# Patient Record
Sex: Female | Born: 2005 | Race: White | Hispanic: No | Marital: Single | Smoking: Former smoker
Health system: Southern US, Community
[De-identification: ages and names within clinical notes are randomized; demographics above are authoritative.]

## PROBLEM LIST (undated history)

## (undated) DIAGNOSIS — G90A Postural orthostatic tachycardia syndrome (POTS): Secondary | ICD-10-CM

## (undated) DIAGNOSIS — G901 Familial dysautonomia [Riley-Day]: Secondary | ICD-10-CM

## (undated) DIAGNOSIS — F419 Anxiety disorder, unspecified: Secondary | ICD-10-CM

## (undated) DIAGNOSIS — F32A Depression, unspecified: Secondary | ICD-10-CM

## (undated) DIAGNOSIS — T753XXA Motion sickness, initial encounter: Secondary | ICD-10-CM

## (undated) HISTORY — DX: Anxiety disorder, unspecified: F41.9

## (undated) HISTORY — DX: Depression, unspecified: F32.A

## (undated) HISTORY — PX: NO PAST SURGERIES: SHX2092

## (undated) HISTORY — DX: Postural orthostatic tachycardia syndrome (POTS): G90.A

---
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
## 2024-03-02 ENCOUNTER — Ambulatory Visit: Payer: Self-pay | Admitting: Internal Medicine

## 2024-03-05 ENCOUNTER — Ambulatory Visit (INDEPENDENT_AMBULATORY_CARE_PROVIDER_SITE_OTHER): Admitting: Internal Medicine

## 2024-03-05 ENCOUNTER — Encounter: Payer: Self-pay | Admitting: Internal Medicine

## 2024-03-05 VITALS — BP 108/70 | Ht 66.0 in | Wt 160.8 lb

## 2024-03-05 DIAGNOSIS — Z0001 Encounter for general adult medical examination with abnormal findings: Secondary | ICD-10-CM | POA: Diagnosis not present

## 2024-03-05 DIAGNOSIS — Z6825 Body mass index (BMI) 25.0-25.9, adult: Secondary | ICD-10-CM

## 2024-03-05 DIAGNOSIS — Z114 Encounter for screening for human immunodeficiency virus [HIV]: Secondary | ICD-10-CM

## 2024-03-05 DIAGNOSIS — Z1159 Encounter for screening for other viral diseases: Secondary | ICD-10-CM

## 2024-03-05 DIAGNOSIS — Z136 Encounter for screening for cardiovascular disorders: Secondary | ICD-10-CM

## 2024-03-05 DIAGNOSIS — R739 Hyperglycemia, unspecified: Secondary | ICD-10-CM

## 2024-03-05 DIAGNOSIS — E663 Overweight: Secondary | ICD-10-CM | POA: Diagnosis not present

## 2024-03-05 MED ORDER — ESCITALOPRAM OXALATE 10 MG PO TABS: 15.0000 mg | ORAL_TABLET | Freq: Every day | ORAL | 1 refills | Status: DC

## 2024-03-05 NOTE — Progress Notes (Signed)
 Subjective:    Patient ID: Samantha Mccormick Cancer, female    DOB: 03-Apr-2006, 18 y.o.   MRN: [id number]  HPI  Patient presents to clinic today for her annual exam.  Flu: never Tetanus: 03/2016 COVID: never Dentist: biannually  Diet: She does eat meat. She eats more fruits than veggies. She does eat some fried foods. She drinks mostly water. Exercise: None  Review of Systems   Past Medical History:  Diagnosis Date   Anxiety    Phreesia 11/08/2020   Depression    Dysautonomia (HCC)    POTS   Motion sickness    seasick   POTS (postural orthostatic tachycardia syndrome)     Current Outpatient Medications  Medication Sig Dispense Refill   escitalopram  (LEXAPRO ) 10 MG tablet Take 1 tablet (10 mg total) by mouth daily. 90 tablet 1   No current facility-administered medications for this visit.    No Active Allergies   Family History  Problem Relation Age of Onset   Migraines Mother    Anxiety disorder Mother    Depression Mother    Depression Father    Anxiety disorder Father    Bipolar disorder Father    ADD / ADHD Sister    ADD / ADHD Brother    Epilepsy Paternal Grandmother    Depression Paternal Grandmother    Anxiety disorder Paternal Grandmother    Bipolar disorder Paternal Grandmother    Schizophrenia Neg Hx    Autism Neg Hx     Social History   Socioeconomic History   Marital status: Single    Spouse name: Not on file   Number of children: Not on file   Years of education: Not on file   Highest education level: High school graduate  Occupational History   Not on file  Tobacco Use   Smoking status: Former    Types: E-cigarettes    Passive exposure: Past   Smokeless tobacco: Never  Vaping Use   Vaping status: Every Day   Substances: Nicotine, Flavoring   Devices: Lost Mary  Substance and Sexual Activity   Alcohol use: Never   Drug use: Never   Sexual activity: Yes    Birth control/protection: None  Other Topics Concern   Not on file  Social  History Narrative   Samantha Mccormick lives with mother, stepfather and siblings.    Samantha Mccormick is in the 10th grade at [employer].        Social Drivers of Health   Financial Resource Strain: Patient Declined (09/01/2023)   Overall Financial Resource Strain (CARDIA)    Difficulty of Paying Living Expenses: Patient declined  Food Insecurity: No Food Insecurity (09/01/2023)   Hunger Vital Sign    Worried About Running Out of Food in the Last Year: Never true    Ran Out of Food in the Last Year: Never true  Transportation Needs: No Transportation Needs (09/01/2023)   PRAPARE - Administrator, Civil Service (Medical): No    Lack of Transportation (Non-Medical): No  Physical Activity: Inactive (09/01/2023)   Exercise Vital Sign    Days of Exercise per Week: 0 days    Minutes of Exercise per Session: 0 min  Stress: Patient Declined (09/01/2023)   [employer] of Occupational Health - Occupational Stress Questionnaire    Feeling of Stress : Patient declined  Social Connections: Moderately Isolated (09/01/2023)   Social Connection and Isolation Panel    Frequency of Communication with Friends and Family: More than three times  a week    Frequency of Social Gatherings with Friends and Family: Twice a week    Attends Religious Services: 1 to 4 times per year    Active Member of [employer] or Organizations: No    Attends Banker Meetings: Never    Marital Status: Never married  Intimate Partner Violence: Not At Risk (09/01/2023)   Humiliation, Afraid, Rape, and Kick questionnaire    Fear of Current or Ex-Partner: No    Emotionally Abused: No    Physically Abused: No    Sexually Abused: No     Constitutional: Denies fever, malaise, fatigue, headache or abrupt weight changes.  HEENT: Denies eye pain, eye redness, ear pain, ringing in the ears, wax buildup, runny nose, nasal congestion, bloody nose, or sore throat. Respiratory: Denies difficulty breathing, shortness of  breath, cough or sputum production.   Cardiovascular: Patient reports intermittent palpitations.  Denies chest pain, chest tightness, or swelling in the hands or feet.  Gastrointestinal: Denies abdominal pain, bloating, constipation, diarrhea or blood in the stool.  GU: Denies urgency, frequency, pain with urination, burning sensation, blood in urine, odor or discharge. Musculoskeletal: Denies decrease in range of motion, difficulty with gait, muscle pain or joint pain and swelling.  Skin: Patient reports acne.  Denies redness, rashes, lesions or ulcercations.  Neurological: Denies dizziness, difficulty with memory, difficulty with speech or problems with balance and coordination.  Psych: Patient has a history of anxiety.  Denies depression, SI/HI.  No other specific complaints in a complete review of systems (except as listed in HPI above).      Objective:   Physical Exam BP 108/70 (BP Location: Left Arm, Patient Position: Sitting, Cuff Size: Normal)   Ht 5' 6 (1.676 m)   Wt 160 lb 12.8 oz (72.9 kg)   LMP 02/13/2024 (Exact Date)   BMI 25.95 kg/m   Wt Readings from Last 3 Encounters:  09/01/23 157 lb (71.2 kg) (88%, Z= 1.15)*  04/24/23 160 lb 4.8 oz (72.7 kg) (90%, Z= 1.26)*  10/01/22 140 lb 11.2 oz (63.8 kg) (77%, Z= 0.74)*   * Growth percentiles are based on CDC (Girls, 2-20 Years) data.    General: Appears her stated age, overweight, in NAD. Skin: Warm, dry and intact. No rashes, lesions or ulcerations noted. HEENT: Head: normal shape and size; Eyes: sclera white, no icterus, conjunctiva pink, PERRLA and EOMs intact;  Neck:  Neck supple, trachea midline. No masses, lumps or thyromegaly present.  Cardiovascular: Normal rate and rhythm. S1,S2 noted.  No murmur, rubs or gallops noted. No JVD or BLE edema.  Pulmonary/Chest: Normal effort and positive vesicular breath sounds. No respiratory distress. No wheezes, rales or ronchi noted.  Abdomen: Soft and nontender. Normal bowel  sounds.  Musculoskeletal: Strength 5/5 BUE/BLE.  No difficulty with gait.  Neurological: Alert and oriented. Cranial nerves II-XII grossly intact. Coordination normal.  Psychiatric: Mood and affect normal. Behavior is normal. Judgment and thought content normal.            Assessment & Plan:   Preventative health maintenance:  Encouraged her to get her flu shot in the fall Tetanus UTD Encouraged her to get her COVID vaccine Encouraged her to consume a balanced diet and exercise regimen Advised her to see a dentist annually Will check CBC, c-Met, lipid, A1c, HIV and hep C today  RTC in 1 year for your annual exam Angeline Laura, NP

## 2024-03-05 NOTE — Assessment & Plan Note (Signed)
 Encouraged diet and exercise for weight loss ?

## 2024-03-05 NOTE — Patient Instructions (Signed)

## 2024-03-06 LAB — CBC
HCT: 41.3 % (ref 35.0–45.0)
Hemoglobin: 13.3 g/dL (ref 11.7–15.5)
MCH: 28.7 pg (ref 27.0–33.0)
MCHC: 32.2 g/dL (ref 32.0–36.0)
MCV: 89 fL (ref 80.0–100.0)
MPV: 12.5 fL (ref 7.5–12.5)
Platelets: 204 10*3/uL (ref 140–400)
RBC: 4.64 10*6/uL (ref 3.80–5.10)
RDW: 12.2 % (ref 11.0–15.0)
WBC: 8.2 10*3/uL (ref 3.8–10.8)

## 2024-03-06 LAB — COMPREHENSIVE METABOLIC PANEL WITH GFR
AG Ratio: 2 (calc) (ref 1.0–2.5)
ALT: 10 U/L (ref 5–32)
AST: 14 U/L (ref 12–32)
Albumin: 4.7 g/dL (ref 3.6–5.1)
Alkaline phosphatase (APISO): 54 U/L (ref 36–128)
BUN: 10 mg/dL (ref 7–20)
CO2: 26 mmol/L (ref 20–32)
Calcium: 9.5 mg/dL (ref 8.9–10.4)
Chloride: 105 mmol/L (ref 98–110)
Creat: 0.6 mg/dL (ref 0.50–0.96)
Globulin: 2.3 g/dL (ref 2.0–3.8)
Glucose, Bld: 59 mg/dL — ABNORMAL LOW (ref 65–139)
Potassium: 4.1 mmol/L (ref 3.8–5.1)
Sodium: 140 mmol/L (ref 135–146)
Total Bilirubin: 0.3 mg/dL (ref 0.2–1.1)
Total Protein: 7 g/dL (ref 6.3–8.2)
eGFR: 133 mL/min/{1.73_m2} (ref >=60)

## 2024-03-06 LAB — LIPID PANEL
Cholesterol: 129 mg/dL (ref <170)
HDL: 51 mg/dL (ref >45)
LDL Cholesterol (Calc): 62 mg/dL (ref <110)
Non-HDL Cholesterol (Calc): 78 mg/dL (ref <120)
Total CHOL/HDL Ratio: 2.5 (calc) (ref <5.0)
Triglycerides: 75 mg/dL (ref <90)

## 2024-03-06 LAB — HIV ANTIBODY (ROUTINE TESTING W REFLEX): HIV 1&2 Ab, 4th Generation: NONREACTIVE

## 2024-03-06 LAB — HEMOGLOBIN A1C
Hgb A1c MFr Bld: 5.1 % (ref <5.7)
Mean Plasma Glucose: 100 mg/dL
eAG (mmol/L): 5.5 mmol/L

## 2024-03-06 LAB — HEPATITIS C ANTIBODY: Hepatitis C Ab: NONREACTIVE

## 2024-03-08 ENCOUNTER — Ambulatory Visit: Payer: Self-pay | Admitting: Internal Medicine

## 2024-03-21 ENCOUNTER — Other Ambulatory Visit: Payer: Self-pay | Admitting: Internal Medicine

## 2024-03-23 NOTE — Telephone Encounter (Signed)
 Too soon for refill, refilled 03/05/24 for 90 and 1 RF.  Requested Prescriptions  Pending Prescriptions Disp Refills   escitalopram  (LEXAPRO ) 10 MG tablet [Pharmacy Med Name: ESCITALOPRAM  10 MG TABLET] 90 tablet 1    Sig: TAKE 1 TABLET BY MOUTH EVERY DAY     Psychiatry:  Antidepressants - SSRI Passed - 03/23/2024 12:09 PM      Passed - Valid encounter within last 6 months    Recent Outpatient Visits           2 weeks ago Encounter for general adult medical examination with abnormal findings   [facility] [place], Angeline ORN, NP

## 2024-05-03 ENCOUNTER — Encounter: Payer: Self-pay | Admitting: Internal Medicine

## 2024-05-05 ENCOUNTER — Ambulatory Visit: Admitting: Internal Medicine

## 2024-06-24 ENCOUNTER — Telehealth: Admitting: Emergency Medicine

## 2024-06-24 DIAGNOSIS — B001 Herpesviral vesicular dermatitis: Secondary | ICD-10-CM | POA: Diagnosis not present

## 2024-06-24 MED ORDER — VALACYCLOVIR HCL 1 G PO TABS: 2000.0000 mg | ORAL_TABLET | Freq: Two times a day (BID) | ORAL | 1 refills | Status: DC

## 2024-06-24 NOTE — Progress Notes (Signed)
We are sorry that you are not feeling well.  Here is how we plan to help!  Based on what you have shared with me it does look like you have a viral infection.    Most cold sores or fever blisters are small fluid filled blisters around the mouth caused by herpes simplex virus.  The most common strain of the virus causing cold sores is herpes simplex virus 1.  It can be spread by skin contact, sharing eating utensils, or even sharing towels.  Cold sores are contagious to other people until dry. (Approximately 5-7 days).  Wash your hands. You can spread the virus to your eyes through handling your contact lenses after touching the lesions.  Most people experience pain at the sight or tingling sensations in their lips that may begin before the ulcers erupt.  Herpes simplex is treatable but not curable.  It may lie dormant for a long time and then reappear due to stress or prolonged sun exposure.  Many patients have success in treating their cold sores with an over the counter topical called Abreva.  You may apply the cream up to 5 times daily (maximum 10 days) until healing occurs.  If you would like to use an oral antiviral medication to speed the healing of your cold sore, I have sent a prescription to your local pharmacy Valacyclovir 2 gm take one by mouth twice a day for 1 day    HOME CARE:  Wash your hands frequently. Do not pick at or rub the sore. Don't open the blisters. Avoid kissing other people during this time. Avoid sharing drinking glasses, eating utensils, or razors. Do not handle contact lenses unless you have thoroughly washed your hands with soap and warm water! Avoid oral sex during this time.  Herpes from sores on your mouth can spread to your partner's genital area. Avoid contact with anyone who has eczema or a weakened immune system. Cold sores are often triggered by exposure to intense sunlight, use a lip balm containing a sunscreen (SPF 30 or higher).  GET HELP RIGHT AWAY  IF:  Blisters look infected. Blisters occur near or in the eye. Symptoms last longer than 10 days. Your symptoms become worse.  MAKE SURE YOU:  Understand these instructions. Will watch your condition. Will get help right away if you are not doing well or get worse.    Your e-visit answers were reviewed by a board certified advanced clinical practitioner to complete your personal care plan.  Depending upon the condition, your plan could have  Included both over the counter or prescription medications.    Please review your pharmacy choice.  Be sure that the pharmacy you have chosen is open so that you can pick up your prescription now.  If there is a problem you can message your provider in [place] to have the prescription routed to another pharmacy.    Your safety is important to [place].  If you have drug allergies check our prescription carefully.  For the next 24 hours you can use MyChart to ask questions about today's visit, request a non-urgent call back, or ask for a work or school excuse from your e-visit provider.  You will get an email in the next two days asking about your experience.  I hope that your e-visit has been valuable and will speed your recovery.  I have spent 5 minutes in review of e-visit questionnaire, review and updating patient chart, medical decision making and response to patient.  Rica Mast, PhD, FNP-BC

## 2024-07-03 ENCOUNTER — Telehealth: Admitting: Physician Assistant

## 2024-07-03 DIAGNOSIS — R112 Nausea with vomiting, unspecified: Secondary | ICD-10-CM

## 2024-07-03 NOTE — Progress Notes (Signed)
  Because of your decreased urination, I feel your condition warrants further evaluation and I recommend that you be seen in a face-to-face visit.   NOTE: There will be NO CHARGE for this E-Visit   If you are having a true medical emergency, please call 911.     For an urgent face to face visit, [facility] has multiple urgent care centers for your convenience.  Click the link below for the full list of locations and hours, walk-in wait times, appointment scheduling options and driving directions:  Urgent Care - [place], [place], [place], [place], [place], [place]  [place]     Your MyChart E-visit questionnaire answers were reviewed by a board certified advanced clinical practitioner to complete your personal care plan based on your specific symptoms.    Thank you for using e-Visits.

## 2024-07-07 ENCOUNTER — Telehealth: Admitting: Physician Assistant

## 2024-07-07 DIAGNOSIS — R3989 Other symptoms and signs involving the genitourinary system: Secondary | ICD-10-CM | POA: Diagnosis not present

## 2024-07-07 MED ORDER — NITROFURANTOIN MONOHYD MACRO 100 MG PO CAPS: 100.0000 mg | ORAL_CAPSULE | Freq: Two times a day (BID) | ORAL | 0 refills | Status: DC

## 2024-07-07 NOTE — Progress Notes (Signed)

## 2024-07-29 ENCOUNTER — Telehealth: Admitting: Physician Assistant

## 2024-07-29 DIAGNOSIS — R3989 Other symptoms and signs involving the genitourinary system: Secondary | ICD-10-CM | POA: Diagnosis not present

## 2024-07-29 MED ORDER — CEPHALEXIN 500 MG PO CAPS: 500.0000 mg | ORAL_CAPSULE | Freq: Two times a day (BID) | ORAL | 0 refills | Status: AC

## 2024-07-29 NOTE — Progress Notes (Signed)

## 2024-09-12 ENCOUNTER — Telehealth

## 2024-09-12 DIAGNOSIS — A084 Viral intestinal infection, unspecified: Secondary | ICD-10-CM

## 2024-09-13 MED ORDER — ONDANSETRON 4 MG PO TBDP: 4.0000 mg | ORAL_TABLET | Freq: Three times a day (TID) | ORAL | 0 refills | Status: AC | PRN

## 2024-09-13 NOTE — Progress Notes (Signed)
 E-Visit for Nausea and Vomiting   We are sorry that you are not feeling well. Here is how we plan to help!  Based on what you have shared with me it looks like you have a Virus that is irritating your GI tract.  Vomiting is the forceful emptying of a portion of the stomach's content through the mouth.  Although nausea and vomiting can make you feel miserable, it's important to remember that these are not diseases, but rather symptoms of an underlying illness.  When we treat short term symptoms, we always caution that any symptoms that persist should be fully evaluated in a medical office.  I have prescribed a medication that will help alleviate your symptoms and allow you to stay hydrated:  Zofran 4 mg 1 tablet every 8 hours as needed for nausea and vomiting  For your symptoms of diarrhea you may take Imodium 2 mg tablets that are over the counter at your local pharmacy. Take two tablet now and then one after each loose stool up to 6 a day.    HOME CARE: Drink clear liquids.  This is very important! Dehydration (the lack of fluid) can lead to a serious complication.  Start off with 1 tablespoon every 5 minutes for 8 hours. You may begin eating bland foods after 8 hours without vomiting.  Start with saltine crackers, white bread, rice, mashed potatoes, applesauce. After 48 hours on a bland diet, you may resume a normal diet. Try to go to sleep.  Sleep often empties the stomach and relieves the need to vomit.  GET HELP RIGHT AWAY IF:  Your symptoms do not improve or worsen within 2 days after treatment. You have a fever for over 3 days. You cannot keep down fluids after trying the medication.  MAKE SURE YOU:  Understand these instructions. Will watch your condition. Will get help right away if you are not doing well or get worse.    Thank you for choosing an e-visit.  Your e-visit answers were reviewed by a board certified advanced clinical practitioner to complete your personal care  plan. Depending upon the condition, your plan could have included both over the counter or prescription medications.  Please review your pharmacy choice. Make sure the pharmacy is open so you can pick up prescription now. If there is a problem, you may contact your provider through [employer] and have the prescription routed to another pharmacy.  Your safety is important to [place]. If you have drug allergies check your prescription carefully.   For the next 24 hours you can use MyChart to ask questions about today's visit, request a non-urgent call back, or ask for a work or school excuse. You will get an email in the next two days asking about your experience. I hope that your e-visit has been valuable and will speed your recovery.  I have spent 5 minutes in review of e-visit questionnaire, review and updating patient chart, medical decision making and response to patient.   Margaretann Loveless, PA-C

## 2024-09-14 ENCOUNTER — Encounter: Payer: Self-pay | Admitting: Family Medicine

## 2024-09-14 ENCOUNTER — Ambulatory Visit: Payer: Self-pay

## 2024-09-14 ENCOUNTER — Ambulatory Visit: Admitting: Family Medicine

## 2024-09-14 ENCOUNTER — Telehealth: Admitting: Physician Assistant

## 2024-09-14 VITALS — BP 126/79 | HR 78 | Resp 15 | Ht 66.02 in | Wt 168.0 lb

## 2024-09-14 DIAGNOSIS — A09 Infectious gastroenteritis and colitis, unspecified: Secondary | ICD-10-CM

## 2024-09-14 DIAGNOSIS — A084 Viral intestinal infection, unspecified: Secondary | ICD-10-CM

## 2024-09-14 MED ORDER — LOPERAMIDE HCL 2 MG PO TABS: ORAL_TABLET | ORAL | 0 refills | Status: AC

## 2024-09-14 NOTE — Progress Notes (Signed)
" °  Because of persistent symptoms despite treatment via e-visit on 1/4, I feel your condition warrants further evaluation and I recommend that you be seen in a face-to-face visit.   NOTE: There will be NO CHARGE for this E-Visit   If you are having a true medical emergency, please call 911.     For an urgent face to face visit, [facility] has multiple urgent care centers for your convenience.  Click the link below for the full list of locations and hours, walk-in wait times, appointment scheduling options and driving directions:  Urgent Care - [place], [place], [place], [place], [place], [place]   [place]     Your MyChart E-visit questionnaire answers were reviewed by a board certified advanced clinical practitioner to complete your personal care plan based on your specific symptoms.    Thank you for using e-Visits.    "

## 2024-09-14 NOTE — Patient Instructions (Addendum)
 You have a viral infection that will resolve on its own over time.  Symptomatic treatment is ideal at this time. Symptoms will last 3-7 days but can stretch out to 10-14 days. Call if you are not improving by 7-10 days as this may have progressed to a bacterial infection.  You can use nasal saline to help with nasal drainage/congestion. Humidification may also be beneficial.  Unfortunately, antibiotics are not helpful for viral infections.  Vitamin Regimen:  Vitamin C 500mg  twice daily  Vitamin D 5000 units once daily  Zinc 50-75mg  once daily   Over the counter Medications (*unless allergic or contraindicated*):  Use Tylenol (acetaminophen) for fever/pain Use Advil (ibuprofen) for fever/pain/inflammation   Non-Medication Therapy:  Drink plenty of fluids and stay hydrated.  A teaspoon of honey may help ease coughing symptoms.  Cough drops or hard candy for coughing.   Over the Counter Medication Therapy:  Use a cough expectorant such as guaifenesin (Mucinex) if recommended by your doctor for a wet, congested cough. If you have high blood pressure, please ask your doctor first before using this.  Use a cough suppressant such as dextromethorphan (Robitussin/Delsym) for a dry cough. If you have high blood pressure, please ask your doctor first before using this.  If you have high blood pressure, medication such as Coricidin HBP is safe to take for your cough and will not increase your blood pressure.    MyChart:  For all urgent or time sensitive needs we ask that you please call the office to avoid delays. Our number is [phone]) [phone]. MyChart is not constantly monitored and due to the large volume of messages a day, replies may take up to 72 business hours.   MyChart Policy: MyChart allows for you to see your visit notes, after visit summary, provider recommendations, lab and tests results, make an appointment, request refills, and contact your provider or the office for non-urgent  questions or concerns. Providers are seeing patients during normal business hours and do not have built in time to review MyChart messages.  We ask that you allow a minimum of 3 business days for responses to [place]. For this reason, please do not send urgent requests through MyChart. Please call the office at [phone]. New and ongoing conditions may require a visit. We have virtual and in person visit available for your convenience.  Complex MyChart concerns may require a visit. Your provider may request you schedule a virtual or in person visit to ensure we are providing the best care possible. MyChart messages sent after 11:00 AM on Friday will not be received by the provider until Monday morning.    Lab and Test Results: You will receive your lab and test results on MyChart as soon as they are completed and results have been sent by the lab or testing facility. Due to this service, you will receive your results BEFORE your provider.  I review lab and tests results each morning prior to seeing patients. Some results require collaboration with other providers to ensure you are receiving the most appropriate care. For this reason, we ask that you please allow a minimum of 3-5 business days from the time the ALL results have been received for your provider to receive and review lab and test results and contact you about these.  Most lab and test result comments from the provider will be sent through MyChart. Your provider may recommend changes to the plan of care, follow-up visits, repeat testing, ask questions, or request  an office visit to discuss these results. You may reply directly to this message or call the office at [phone] to provide information for the provider or set up an appointment. In some instances, you will be called with test results and recommendations. Please let us  know if this is preferred and we will make note of this in your chart to provide this for you.     If you have not heard a response to your lab or test results in 5 business days from all results returning to MyChart, please call the office to let us  know. We ask that you please avoid calling prior to this time unless there is an emergent concern. Due to high call volumes, this can delay the resulting process.   After Hours: For all non-emergency after hours needs, please call the office at [phone] and select the option to reach the on-call provider service. On-call services are shared between multiple [facility] offices and therefore it will not be possible to speak directly with your provider. On-call providers may provide medical advice and recommendations, but are unable to provide refills for maintenance medications.  For all emergency or urgent medical needs after normal business hours, we recommend that you seek care at the closest Urgent Care or Emergency Department to ensure appropriate treatment in a timely manner.  [facility] [place] has a 24 hour emergency room located on the ground floor for your convenience.    Urgent Concerns During the Business Day Providers are seeing patients from 8AM to 5PM, Monday through Thursday, and 8AM to 12PM on Friday with a busy schedule and are most often not able to respond to non-urgent calls until the end of the day or the next business day. If you should have URGENT concerns during the day, please call and speak to the nurse or schedule a same day appointment so that we can address your concern without delay.    Thank you, again, for choosing me as your health care partner. I appreciate your trust and look forward to learning more about you.    Evalene Arts, FNP-C

## 2024-09-14 NOTE — Progress Notes (Signed)
 "     Acute Care Office Visit  Subjective:   Samantha Mccormick Jul 03, 2006 09/14/2024  Chief Complaint  Patient presents with   Diarrhea   Discussed the use of AI scribe software for clinical note transcription with the patient, who gave verbal consent to proceed.  History of Present Illness   Samantha Mccormick is a 19 year old female with POTS who presents with diarrhea and nausea.  She has experienced loose stools for the past four days, beginning on September 10, 2024. The diarrhea occurs immediately after eating, leading her to avoid meals to prevent frequent bathroom visits. She has not used Imodium  but has taken Pepto Bismol and dramamine. Zofran  was prescribed during a virtual visit on 1/4.   Nausea intensifies with eating, causing significant discomfort after consuming even a small amount of food. Despite this, she maintains hydration by keeping fluids down. Attempts to eat bland foods still result in diarrhea. No fever, chills, abdominal pain beyond cramping, urinary symptoms, headache, sore throat, or breathing issues are present. She is currently menstruating but notes that her menstrual cycle typically does not cause nausea or severe diarrhea.  Her past medical history includes POTS, for which she does not take any medications. There have been no recent changes in her medications, and her anxiety levels have not increased. She has not been in contact with anyone who is sick, and her bowel movements are usually regular.      The following portions of the patient's history were reviewed and updated as appropriate: past medical history, past surgical history, family history, social history, allergies, medications, and problem list.   Patient Active Problem List   Diagnosis Date Noted   POTS (postural orthostatic tachycardia syndrome) 09/01/2023   Acne 09/01/2023   GAD (generalized anxiety disorder) 09/01/2023   Overweight with body mass index (BMI) of 25 to 25.9 in adult 09/01/2023    Past Medical History:  Diagnosis Date   Anxiety    [employer] 11/08/2020   Depression    Dysautonomia (HCC)    POTS   Motion sickness    [facility]   POTS (postural orthostatic tachycardia syndrome)    Past Surgical History:  Procedure Laterality Date   NO PAST SURGERIES     TONSILLECTOMY AND ADENOIDECTOMY Bilateral 01/16/2022   Procedure: TONSILLECTOMY AND ADENOIDECTOMY;  Surgeon: Milissa Hamming, MD;  Location: [facility];  Service: ENT;  Laterality: Bilateral;  NO ADENOID TISSUE SENT TO PATHOLOGY. SURGEON CAUDERIZED ADENOID.   Family History  Problem Relation Age of Onset   Migraines Mother    Anxiety disorder Mother    Depression Mother    Depression Father    Anxiety disorder Father    Bipolar disorder Father    ADD / ADHD Sister    ADD / ADHD Brother    Epilepsy Paternal Grandmother    Depression Paternal Grandmother    Anxiety disorder Paternal Grandmother    Bipolar disorder Paternal Grandmother    Schizophrenia Neg Hx    Autism Neg Hx    Outpatient Medications Prior to Visit  Medication Sig Dispense Refill   ondansetron  (ZOFRAN -ODT) 4 MG disintegrating tablet Take 1 tablet (4 mg total) by mouth every 8 (eight) hours as needed. (Patient not taking: Reported on 09/14/2024) 20 tablet 0   escitalopram  (LEXAPRO ) 10 MG tablet Take 1.5 tablets (15 mg total) by mouth daily. 135 tablet 1   nitrofurantoin , macrocrystal-monohydrate, (MACROBID ) 100 MG capsule Take 1 capsule (100 mg total) by mouth 2 (two) times  daily. 10 capsule 0   valACYclovir  (VALTREX ) 1000 MG tablet Take 2 tablets (2,000 mg total) by mouth 2 (two) times daily. 2 tablet 1   No facility-administered medications prior to visit.   Allergies[1]  ROS: A complete ROS was performed with pertinent positives/negatives noted in the HPI. The remainder of the ROS are negative.    Objective:   Today's Vitals   09/14/24 1541  BP: 126/79  Pulse: 78  Resp: 15  SpO2: 98%  Weight: 168 lb (76.2 kg)   Height: 5' 6.02 (1.677 m)  PainSc: 0-No pain   Physical Exam Vitals reviewed.  Constitutional:      Appearance: Normal appearance.  Cardiovascular:     Rate and Rhythm: Normal rate and regular rhythm.     Pulses: Normal pulses.     Heart sounds: Normal heart sounds.  Pulmonary:     Effort: Pulmonary effort is normal.     Breath sounds: Normal breath sounds.  Abdominal:     General: Bowel sounds are normal. There is no distension.     Palpations: Abdomen is soft. There is no mass.     Tenderness: There is no abdominal tenderness. There is no guarding or rebound.  Neurological:     Mental Status: She is alert.  Psychiatric:        Mood and Affect: Mood normal.        Behavior: Behavior normal.       Assessment & Plan:   1. Diarrhea of infectious origin (Primary) Acute diarrhea and nausea likely due to viral gastroenteritis. Vital signs normal. Zofran  minimally effective. No dehydration or significant abdominal pain. Discussed utilizing imodium  4 mg initially, then 2 mg after each loose stool, up to four times daily, along with bland diet: banana, toast, plain rice, applesauce. Encouraged fluid intake for hydration. Consider further evaluation with PCP if symptoms persist by Friday. - loperamide  (IMODIUM  A-D) 2 MG tablet; Take 4mg  initially, and then 2mg  after each loose stool.  Dispense: 30 tablet; Refill: 0   Return if symptoms worsen or fail to improve.    Patient to reach out to office if new, worrisome, or unresolved symptoms arise or if no improvement in patient's condition. Patient verbalized understanding and is agreeable to treatment plan. All questions answered to patient's satisfaction.    Evalene Arts, FNP      [1] No Known Allergies  "

## 2024-09-14 NOTE — Telephone Encounter (Signed)
 FYI Only or Action Required?: FYI only for provider: appointment scheduled on 09/14/24.  Patient was last seen in primary care on 03/05/2024 by Antonette Angeline ORN, NP.  Called Nurse Triage reporting Nausea and Diarrhea.  Symptoms began several days ago.  Interventions attempted: OTC medications: Pepto bismol, dramamine.  Symptoms are: gradually worsening.  Triage Disposition: See Physician Within 24 Hours  Patient/caregiver understands and will follow disposition?: Yes    Nausea with diarrhea x4 days. When she eats is goes straight through her. Has had 4 episodes of diarrhea in the past 24 hours. No blood. Has not thrown up. No fever. Has stomach cramps, no belly pain. Has been drinking fine. No signs of dehydration.   Had an evisit on 1/4 and again today for symptoms. Dx with viral gastroenteritis. Prescribed zofran  which didn't help. Advised to be seen in person for persistent symptoms. Scheduled appt with provider at different office in pt region d/t no availability at home office with any provider within timeframe. Advised UC or ED for worsening symptoms.    Copied from CRM [id number]. Topic: Clinical - Red Word Triage >> Sep 14, 2024  1:41 PM Samantha Mccormick wrote: Red Word that prompted transfer to Nurse Triage: Patient is nauseous and has diarrhea, has been on going for 4 days. Had evisit on 01/04 and symptoms are not any better. Reason for Disposition  [1] MODERATE diarrhea (e.g., 4-6 times / day more than normal) AND [2] present > 48 hours (2 days)  Answer Assessment - Initial Assessment Questions 1. DIARRHEA SEVERITY: How bad is the diarrhea? How many more stools have you had in the past 24 hours than normal?      Has had 4 episodes of diarrhea in the past 24 hours  2. ONSET: When did the diarrhea begin?      4 days ago  3. STOOL DESCRIPTION:  How loose or watery is the diarrhea? What is the stool color? Is there any blood or mucous in the stool?     Liquid  4.  VOMITING: Are you also vomiting? If Yes, ask: How many times in the past 24 hours?      Denies  5. ABDOMEN PAIN: Are you having any abdomen pain? If Yes, ask: What does it feel like? (e.g., crampy, dull, intermittent, constant)      Denies. Just cramps  6. ABDOMEN PAIN SEVERITY: If present, ask: How bad is the pain?  (e.g., Scale 1-10; mild, moderate, or severe)     No pain, just discomfort  7. ORAL INTAKE: If vomiting, Have you been able to drink liquids? How much liquids have you had in the past 24 hours?     Staying hydrated, drinking well  8. HYDRATION: Any signs of dehydration? (e.g., dry mouth [not just dry lips], too weak to stand, dizziness, new weight loss) When did you last urinate?     Denies  9. EXPOSURE: Have you traveled to a foreign country recently? Have you been exposed to anyone with diarrhea? Could you have eaten any food that was spoiled?     Denies  10. ANTIBIOTIC USE: Are you taking antibiotics now or have you taken antibiotics in the past 2 months?       Denies  11. OTHER SYMPTOMS: Do you have any other symptoms? (e.g., fever, blood in stool)       Lethargy  12. PREGNANCY: Is there any chance you are pregnant? When was your last menstrual period?       Denies.  On period currently.  Protocols used: [facility]

## 2025-03-18 ENCOUNTER — Encounter: Admitting: Internal Medicine
